# Patient Record
Sex: Male | Born: 1995 | Race: White | Hispanic: No | Smoking: Current every day smoker
Health system: Southern US, Community
[De-identification: ages and names within clinical notes are randomized; demographics above are authoritative.]

## PROBLEM LIST (undated history)

## (undated) DIAGNOSIS — F909 Attention-deficit hyperactivity disorder, unspecified type: Secondary | ICD-10-CM

## (undated) DIAGNOSIS — M779 Enthesopathy, unspecified: Secondary | ICD-10-CM

## (undated) HISTORY — PX: TONSILLECTOMY: SUR1361

---

## 2014-05-07 ENCOUNTER — Emergency Department: Payer: Self-pay | Admitting: Emergency Medicine

## 2015-11-14 ENCOUNTER — Emergency Department: Payer: BLUE CROSS/BLUE SHIELD

## 2015-11-14 ENCOUNTER — Inpatient Hospital Stay
Admission: EM | Admit: 2015-11-14 | Discharge: 2015-11-16 | DRG: 683 | Disposition: A | Discharge status: Home / Self Care | Payer: BLUE CROSS/BLUE SHIELD | Attending: Internal Medicine | Admitting: Internal Medicine

## 2015-11-14 ENCOUNTER — Inpatient Hospital Stay: Payer: BLUE CROSS/BLUE SHIELD

## 2015-11-14 ENCOUNTER — Encounter: Payer: Self-pay | Admitting: Emergency Medicine

## 2015-11-14 DIAGNOSIS — N170 Acute kidney failure with tubular necrosis: Principal | ICD-10-CM | POA: Diagnosis present

## 2015-11-14 DIAGNOSIS — M6282 Rhabdomyolysis: Secondary | ICD-10-CM | POA: Diagnosis present

## 2015-11-14 DIAGNOSIS — F29 Unspecified psychosis not due to a substance or known physiological condition: Secondary | ICD-10-CM

## 2015-11-14 DIAGNOSIS — R509 Fever, unspecified: Secondary | ICD-10-CM

## 2015-11-14 DIAGNOSIS — Z8249 Family history of ischemic heart disease and other diseases of the circulatory system: Secondary | ICD-10-CM

## 2015-11-14 DIAGNOSIS — F909 Attention-deficit hyperactivity disorder, unspecified type: Secondary | ICD-10-CM | POA: Diagnosis present

## 2015-11-14 DIAGNOSIS — E860 Dehydration: Secondary | ICD-10-CM | POA: Diagnosis present

## 2015-11-14 DIAGNOSIS — R410 Disorientation, unspecified: Secondary | ICD-10-CM

## 2015-11-14 DIAGNOSIS — Y92009 Unspecified place in unspecified non-institutional (private) residence as the place of occurrence of the external cause: Secondary | ICD-10-CM

## 2015-11-14 DIAGNOSIS — N179 Acute kidney failure, unspecified: Secondary | ICD-10-CM

## 2015-11-14 DIAGNOSIS — F15921 Other stimulant use, unspecified with intoxication delirium: Secondary | ICD-10-CM | POA: Diagnosis not present

## 2015-11-14 DIAGNOSIS — Z833 Family history of diabetes mellitus: Secondary | ICD-10-CM

## 2015-11-14 DIAGNOSIS — T43625A Adverse effect of amphetamines, initial encounter: Secondary | ICD-10-CM | POA: Diagnosis present

## 2015-11-14 DIAGNOSIS — D72829 Elevated white blood cell count, unspecified: Secondary | ICD-10-CM | POA: Diagnosis present

## 2015-11-14 DIAGNOSIS — R079 Chest pain, unspecified: Secondary | ICD-10-CM

## 2015-11-14 DIAGNOSIS — F17210 Nicotine dependence, cigarettes, uncomplicated: Secondary | ICD-10-CM | POA: Diagnosis present

## 2015-11-14 HISTORY — DX: Enthesopathy, unspecified: M77.9

## 2015-11-14 HISTORY — DX: Attention-deficit hyperactivity disorder, unspecified type: F90.9

## 2015-11-14 LAB — URINE DRUG SCREEN, QUALITATIVE (ARMC ONLY)
AMPHETAMINES, UR SCREEN: POSITIVE — AB
Barbiturates, Ur Screen: NOT DETECTED
Benzodiazepine, Ur Scrn: NOT DETECTED
CANNABINOID 50 NG, UR ~~LOC~~: NOT DETECTED
Cocaine Metabolite,Ur ~~LOC~~: NOT DETECTED
MDMA (ECSTASY) UR SCREEN: NOT DETECTED
Methadone Scn, Ur: NOT DETECTED
OPIATE, UR SCREEN: NOT DETECTED
PHENCYCLIDINE (PCP) UR S: NOT DETECTED
Tricyclic, Ur Screen: NOT DETECTED

## 2015-11-14 LAB — COMPREHENSIVE METABOLIC PANEL
ALBUMIN: 5.8 g/dL — AB (ref 3.5–5.0)
ALT: 23 U/L (ref 17–63)
AST: 54 U/L — AB (ref 15–41)
Alkaline Phosphatase: 69 U/L (ref 38–126)
Anion gap: 15 (ref 5–15)
BILIRUBIN TOTAL: 2 mg/dL — AB (ref 0.3–1.2)
BUN: 31 mg/dL — AB (ref 6–20)
CHLORIDE: 101 mmol/L (ref 101–111)
CO2: 20 mmol/L — ABNORMAL LOW (ref 22–32)
CREATININE: 3.86 mg/dL — AB (ref 0.61–1.24)
Calcium: 10.4 mg/dL — ABNORMAL HIGH (ref 8.9–10.3)
GFR calc Af Amer: 24 mL/min — ABNORMAL LOW (ref >60)
GFR, EST NON AFRICAN AMERICAN: 21 mL/min — AB (ref >60)
GLUCOSE: 120 mg/dL — AB (ref 65–99)
Potassium: 4.2 mmol/L (ref 3.5–5.1)
Sodium: 136 mmol/L (ref 135–145)
TOTAL PROTEIN: 9.2 g/dL — AB (ref 6.5–8.1)

## 2015-11-14 LAB — URINALYSIS COMPLETE WITH MICROSCOPIC (ARMC ONLY)
BACTERIA UA: NONE SEEN
Bilirubin Urine: NEGATIVE
GLUCOSE, UA: NEGATIVE mg/dL
LEUKOCYTES UA: NEGATIVE
NITRITE: NEGATIVE
PH: 5 (ref 5.0–8.0)
PROTEIN: 100 mg/dL — AB
SPECIFIC GRAVITY, URINE: 1.016 (ref 1.005–1.030)

## 2015-11-14 LAB — BASIC METABOLIC PANEL
Anion gap: 10 (ref 5–15)
BUN: 35 mg/dL — AB (ref 6–20)
CALCIUM: 9 mg/dL (ref 8.9–10.3)
CHLORIDE: 105 mmol/L (ref 101–111)
CO2: 21 mmol/L — AB (ref 22–32)
CREATININE: 2.5 mg/dL — AB (ref 0.61–1.24)
GFR calc non Af Amer: 35 mL/min — ABNORMAL LOW (ref >60)
GFR, EST AFRICAN AMERICAN: 41 mL/min — AB (ref >60)
Glucose, Bld: 133 mg/dL — ABNORMAL HIGH (ref 65–99)
Potassium: 4 mmol/L (ref 3.5–5.1)
SODIUM: 136 mmol/L (ref 135–145)

## 2015-11-14 LAB — CBC
HEMATOCRIT: 49.1 % (ref 40.0–52.0)
Hemoglobin: 16.5 g/dL (ref 13.0–18.0)
MCH: 27.2 pg (ref 26.0–34.0)
MCHC: 33.5 g/dL (ref 32.0–36.0)
MCV: 81.2 fL (ref 80.0–100.0)
Platelets: 357 10*3/uL (ref 150–440)
RBC: 6.05 MIL/uL — ABNORMAL HIGH (ref 4.40–5.90)
RDW: 13.8 % (ref 11.5–14.5)
WBC: 22.6 10*3/uL — AB (ref 3.8–10.6)

## 2015-11-14 LAB — CK: Total CK: 1024 U/L — ABNORMAL HIGH (ref 49–397)

## 2015-11-14 LAB — ETHANOL: Alcohol, Ethyl (B): <5 mg/dL (ref <5)

## 2015-11-14 LAB — SALICYLATE LEVEL: Salicylate Lvl: <4 mg/dL (ref 2.8–30.0)

## 2015-11-14 LAB — ACETAMINOPHEN LEVEL

## 2015-11-14 LAB — GLUCOSE, CAPILLARY: GLUCOSE-CAPILLARY: 114 mg/dL — AB (ref 65–99)

## 2015-11-14 MED ORDER — SODIUM CHLORIDE 0.9% FLUSH: 3.0000 mL | Freq: Two times a day (BID) | INTRAVENOUS | Status: DC

## 2015-11-14 MED ORDER — TEMAZEPAM 15 MG PO CAPS: 15.0000 mg | ORAL_CAPSULE | Freq: Every evening | ORAL | Status: DC | PRN

## 2015-11-14 MED ORDER — ACETAMINOPHEN 650 MG RE SUPP: 650.0000 mg | Freq: Four times a day (QID) | RECTAL | Status: DC | PRN

## 2015-11-14 MED ORDER — SODIUM CHLORIDE 0.9 % IV SOLN
1000.0000 mL | Freq: Once | INTRAVENOUS | Status: AC
  Administered 2015-11-14: 1000 mL via INTRAVENOUS

## 2015-11-14 MED ORDER — ONDANSETRON HCL 4 MG/2ML IJ SOLN: 4.0000 mg | Freq: Four times a day (QID) | INTRAMUSCULAR | Status: DC | PRN

## 2015-11-14 MED ORDER — NICOTINE 21 MG/24HR TD PT24
21.0000 mg | MEDICATED_PATCH | Freq: Every day | TRANSDERMAL | Status: DC
  Administered 2015-11-14 – 2015-11-16 (×3): 21 mg via TRANSDERMAL
  Filled 2015-11-14 (×4): qty 1

## 2015-11-14 MED ORDER — ACETAMINOPHEN 325 MG PO TABS: 650.0000 mg | ORAL_TABLET | Freq: Four times a day (QID) | ORAL | Status: DC | PRN

## 2015-11-14 MED ORDER — HEPARIN SODIUM (PORCINE) 5000 UNIT/ML IJ SOLN
5000.0000 [IU] | Freq: Three times a day (TID) | INTRAMUSCULAR | Status: DC
  Administered 2015-11-14 – 2015-11-16 (×6): 5000 [IU] via SUBCUTANEOUS
  Filled 2015-11-14 (×6): qty 1

## 2015-11-14 MED ORDER — ONDANSETRON HCL 4 MG PO TABS: 4.0000 mg | ORAL_TABLET | Freq: Four times a day (QID) | ORAL | Status: DC | PRN

## 2015-11-14 MED ORDER — SODIUM CHLORIDE 0.9 % IV SOLN
INTRAVENOUS | Status: DC
  Administered 2015-11-14 – 2015-11-16 (×6): via INTRAVENOUS

## 2015-11-14 NOTE — ED Notes (Signed)
Pt in and out cath's - tolerated procedure well

## 2015-11-14 NOTE — ED Notes (Signed)
Informed RN bed ready 
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
## 2015-11-14 NOTE — ED Notes (Signed)
Pt reports unable to void at this time.
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
## 2015-11-14 NOTE — ED Notes (Signed)
Blood Glucose 114
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
## 2015-11-15 ENCOUNTER — Inpatient Hospital Stay: Payer: BLUE CROSS/BLUE SHIELD

## 2015-11-15 ENCOUNTER — Inpatient Hospital Stay (HOSPITAL_COMMUNITY): Payer: BLUE CROSS/BLUE SHIELD

## 2015-11-15 LAB — BASIC METABOLIC PANEL
Anion gap: 7 (ref 5–15)
BUN: 29 mg/dL — AB (ref 6–20)
CALCIUM: 8.3 mg/dL — AB (ref 8.9–10.3)
CHLORIDE: 108 mmol/L (ref 101–111)
CO2: 20 mmol/L — ABNORMAL LOW (ref 22–32)
CREATININE: 1.13 mg/dL (ref 0.61–1.24)
Glucose, Bld: 98 mg/dL (ref 65–99)
Potassium: 3.4 mmol/L — ABNORMAL LOW (ref 3.5–5.1)
SODIUM: 135 mmol/L (ref 135–145)

## 2015-11-15 LAB — CBC
HCT: 37.9 % — ABNORMAL LOW (ref 40.0–52.0)
Hemoglobin: 12.6 g/dL — ABNORMAL LOW (ref 13.0–18.0)
MCH: 27.5 pg (ref 26.0–34.0)
MCHC: 33.3 g/dL (ref 32.0–36.0)
MCV: 82.6 fL (ref 80.0–100.0)
PLATELETS: 229 10*3/uL (ref 150–440)
RBC: 4.59 MIL/uL (ref 4.40–5.90)
RDW: 14.3 % (ref 11.5–14.5)
WBC: 11.3 10*3/uL — AB (ref 3.8–10.6)

## 2015-11-15 LAB — CK: CK TOTAL: 560 U/L — AB (ref 49–397)
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
## 2015-11-15 NOTE — Plan of Care (Signed)
Problem: Safety: Goal: Ability to remain free from injury will improve Outcome: Progressing Pt has remained free from falls and other harm during my care. Pt is currently under IVC and has a sitter at the bedside. Pt has made no attempts to leave the facility.  Problem: Pain Managment: Goal: General experience of comfort will improve Outcome: Progressing Pt complains of pain in legs that intensifies when pt is walking. Pt assisted to bathroom during my shift with two assist.   Problem: Fluid Volume: Goal: Ability to maintain a balanced intake and output will improve Outcome: Progressing Pt has eaten 100% of all meals during my shift.
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
## 2015-11-16 LAB — BASIC METABOLIC PANEL WITH GFR
Anion gap: 3 — ABNORMAL LOW (ref 5–15)
BUN: 12 mg/dL (ref 6–20)
CO2: 26 mmol/L (ref 22–32)
Calcium: 8.6 mg/dL — ABNORMAL LOW (ref 8.9–10.3)
Chloride: 109 mmol/L (ref 101–111)
Creatinine, Ser: 0.72 mg/dL (ref 0.61–1.24)
GFR calc Af Amer: >60 mL/min
GFR calc non Af Amer: >60 mL/min
Glucose, Bld: 89 mg/dL (ref 65–99)
Potassium: 4.2 mmol/L (ref 3.5–5.1)
Sodium: 138 mmol/L (ref 135–145)

## 2015-11-16 LAB — PROLACTIN: Prolactin: 10.8 ng/mL (ref 4.0–15.2)

## 2015-11-16 LAB — CK: CK TOTAL: 280 U/L (ref 49–397)
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
## 2015-11-16 NOTE — Progress Notes (Signed)
Patient discharged to home . Concerns addressed. Patient father present and will take patient home. Follow-up appointment added to discharge summary.
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
## 2015-12-05 ENCOUNTER — Ambulatory Visit: Payer: Self-pay | Admitting: Psychiatry
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
## 2017-03-25 IMAGING — DX DG CHEST 1V PORT
1 series · 1 of 1 positions shown · non-contrast
Comparison: Chest radiograph performed 11/14/2015

CLINICAL DATA: Acute onset of fever.  Initial encounter.

EXAM:
PORTABLE CHEST 1 VIEW

[chest ap]
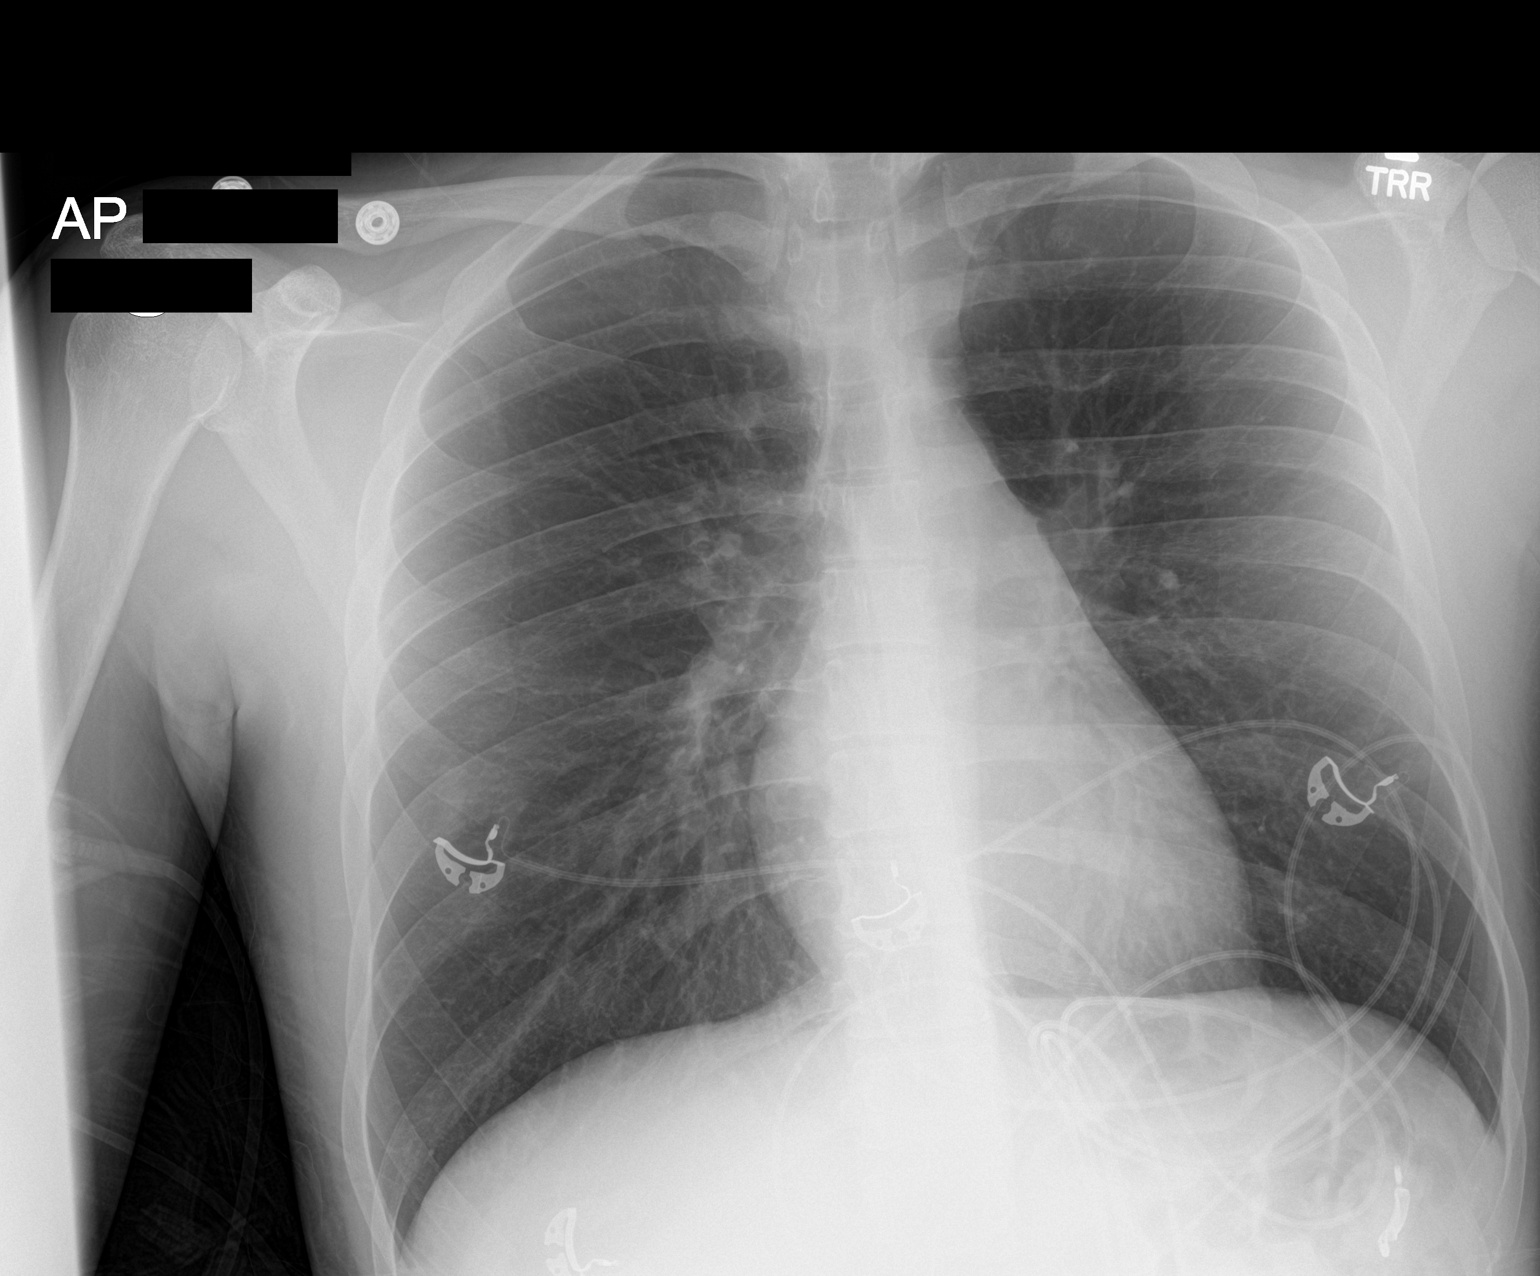

[1 of 1 positions shown; findings below may reference images not displayed]

FINDINGS: The lungs are well-aerated and clear. There is no evidence of focal
opacification, pleural effusion or pneumothorax.

The heart is normal in size; the mediastinal contour is within
normal limits. No acute osseous abnormalities are seen.
IMPRESSION: No acute cardiopulmonary process seen.
